# Patient Record
Sex: Female | Born: 1955 | Race: Black or African American | Hispanic: No | Smoking: Never smoker
Health system: Southern US, Community
[De-identification: ages and names within clinical notes are randomized; demographics above are authoritative.]

## PROBLEM LIST (undated history)

## (undated) DIAGNOSIS — I341 Nonrheumatic mitral (valve) prolapse: Secondary | ICD-10-CM

## (undated) DIAGNOSIS — I10 Essential (primary) hypertension: Secondary | ICD-10-CM

---

## 2005-01-12 ENCOUNTER — Encounter: Admission: RE | Admit: 2005-01-12 | Discharge: 2005-01-12 | Payer: Self-pay | Admitting: Internal Medicine

## 2005-01-25 ENCOUNTER — Other Ambulatory Visit: Admission: RE | Admit: 2005-01-25 | Discharge: 2005-01-25 | Payer: Self-pay | Admitting: Obstetrics and Gynecology

## 2006-04-28 ENCOUNTER — Ambulatory Visit: Payer: Self-pay | Admitting: Family Medicine

## 2006-04-28 LAB — CONVERTED CEMR LAB
ALT: 16 units/L (ref 0–40)
AST: 17 units/L (ref 0–37)
Albumin: 3.8 g/dL (ref 3.5–5.2)
Alkaline Phosphatase: 58 units/L (ref 39–117)
BUN: 7 mg/dL (ref 6–23)
CO2: 30 meq/L (ref 19–32)
Calcium: 9.3 mg/dL (ref 8.4–10.5)
Chloride: 105 meq/L (ref 96–112)
Cholesterol: 203 mg/dL (ref 0–200)
Creatinine, Ser: 0.8 mg/dL (ref 0.4–1.2)
Direct LDL: 157.3 mg/dL
GFR calc Af Amer: 98 mL/min
GFR calc non Af Amer: 81 mL/min
Glucose, Bld: 90 mg/dL (ref 70–99)
HDL: 41.2 mg/dL (ref >39.0)
Potassium: 3.7 meq/L (ref 3.5–5.1)
Sodium: 140 meq/L (ref 135–145)
Total Bilirubin: 0.8 mg/dL (ref 0.3–1.2)
Total CHOL/HDL Ratio: 4.9
Total Protein: 6.9 g/dL (ref 6.0–8.3)
Triglycerides: 58 mg/dL (ref 0–149)
VLDL: 12 mg/dL (ref 0–40)

## 2006-06-26 ENCOUNTER — Ambulatory Visit: Payer: Self-pay | Admitting: Family Medicine

## 2006-09-23 IMAGING — US US TRANSVAGINAL NON-OB
1 series · 14 of 25 positions shown · non-contrast
Comparison: none

CLINICAL DATA: Left lower quadrant pain.
TRANSABDOMINAL AND TRANSVAGINAL PELVIC ULTRASOUND:

[Series 1: unknown · 0.20mm/px · 14 of 71 slices shown]
[im 1/71]
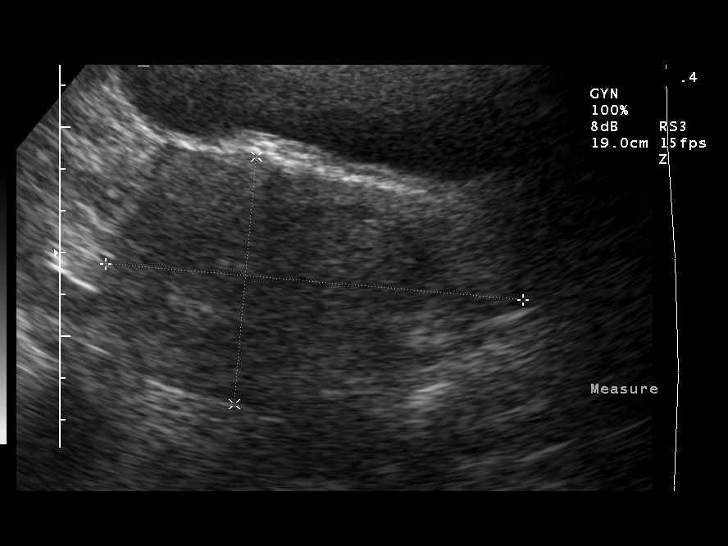
[im 6/71]
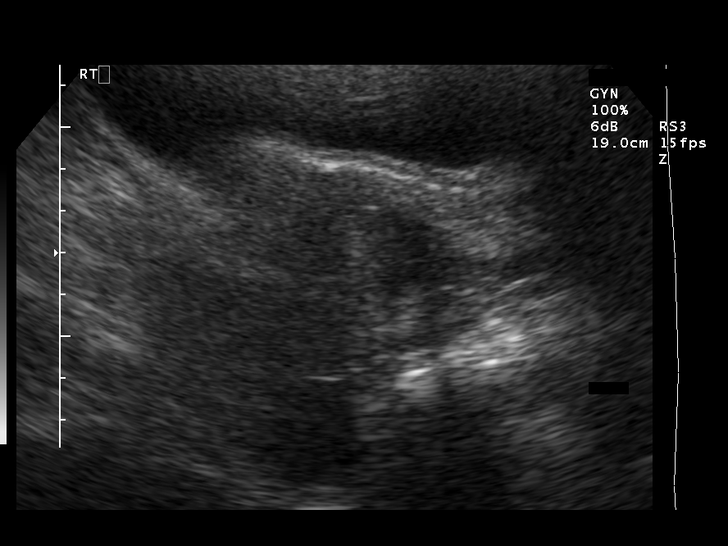
[im 12/71]
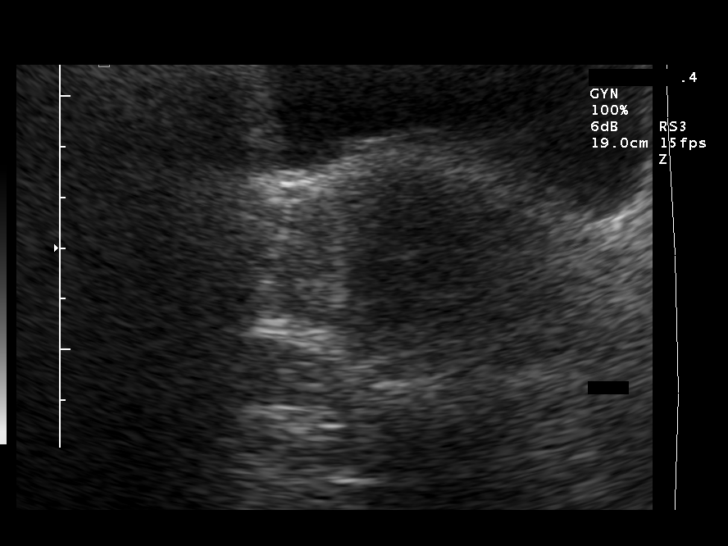
[im 18/71]
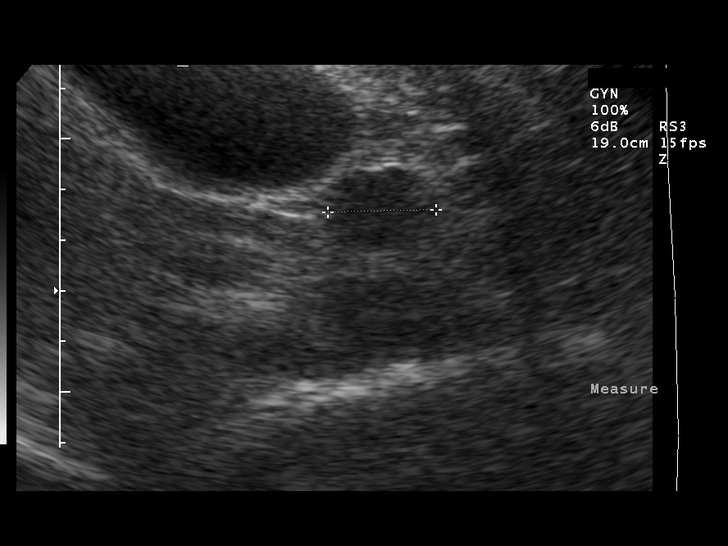
[im 24/71]
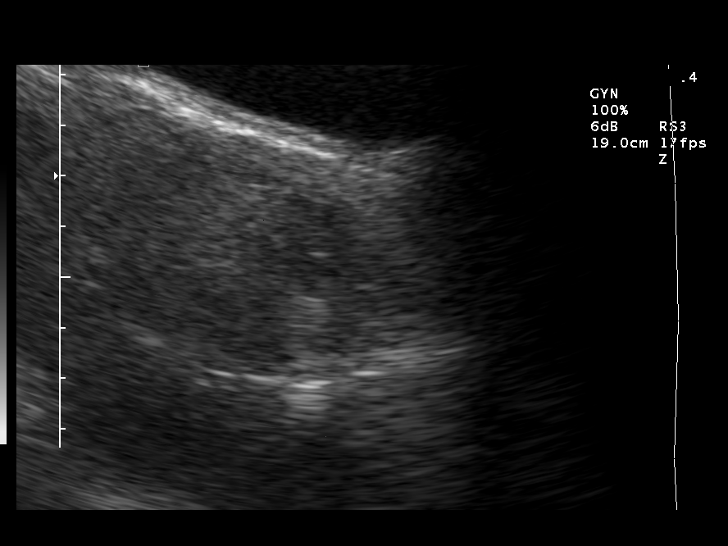
[im 27/71]
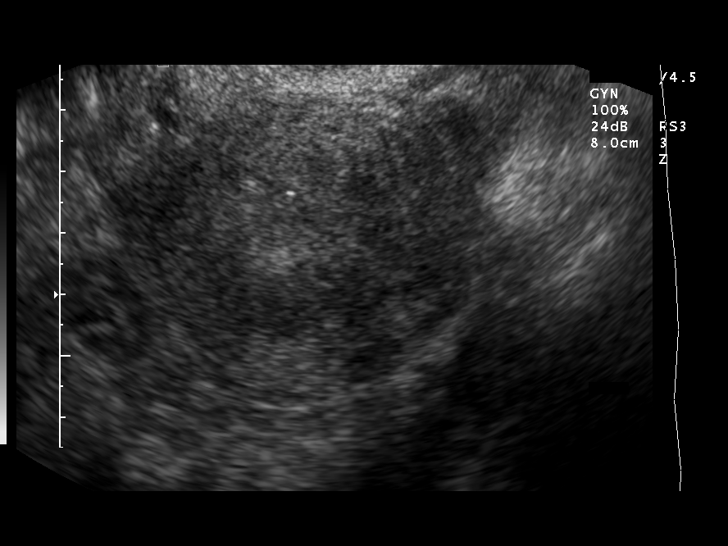
[im 33/71]
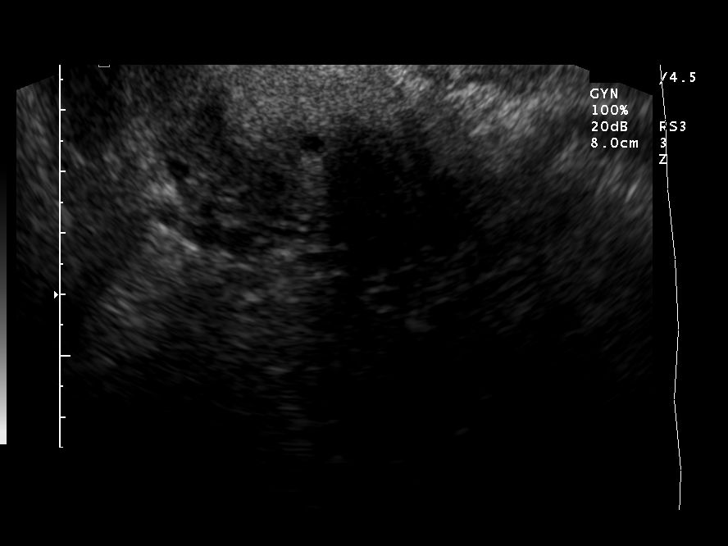
[im 38/71]
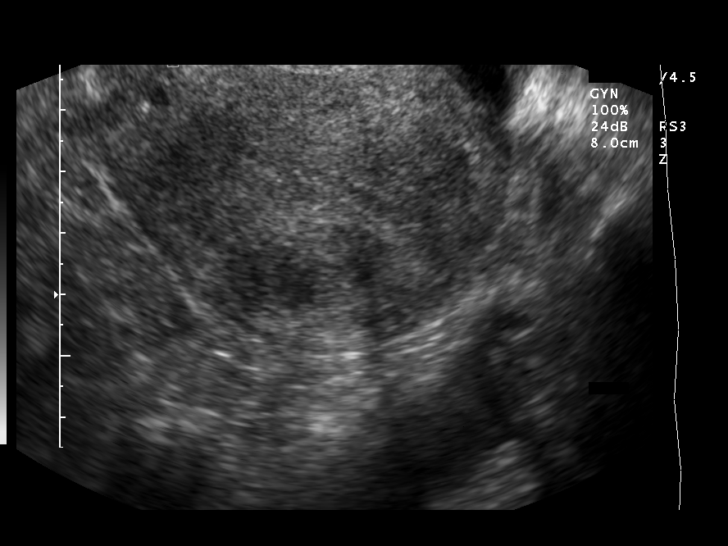
[im 44/71]
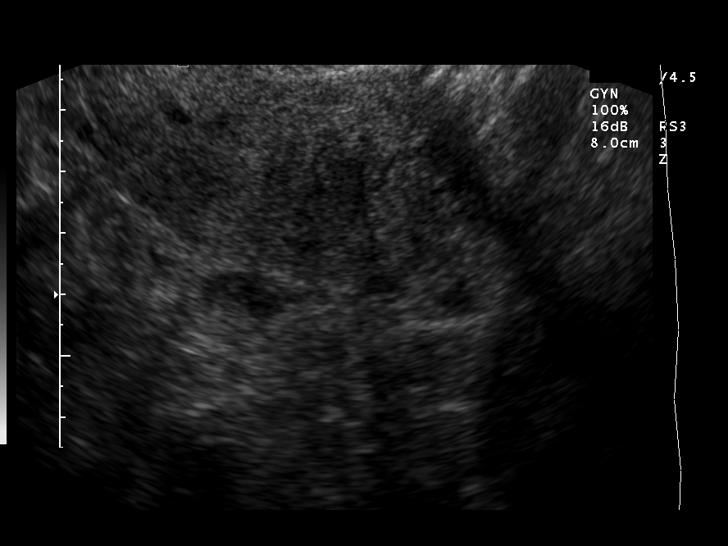
[im 47/71]
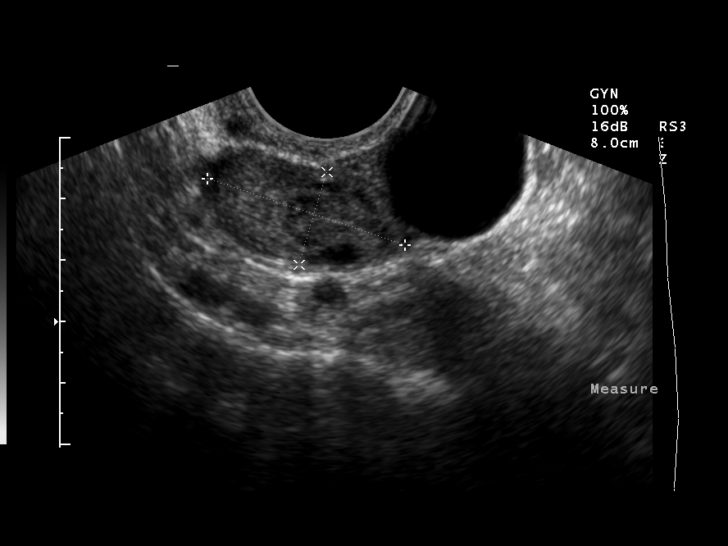
[im 53/71]
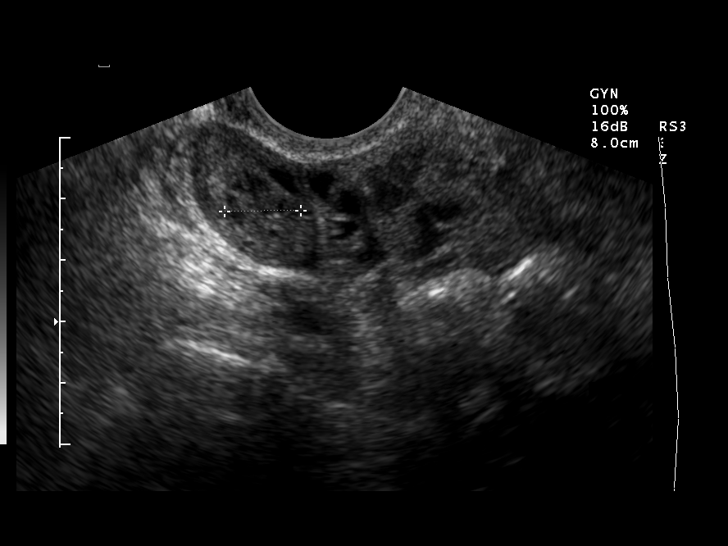
[im 59/71]
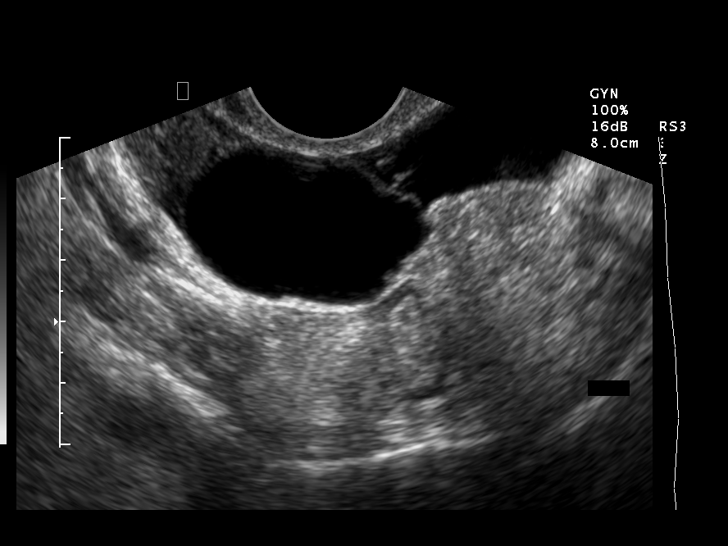
[im 65/71]
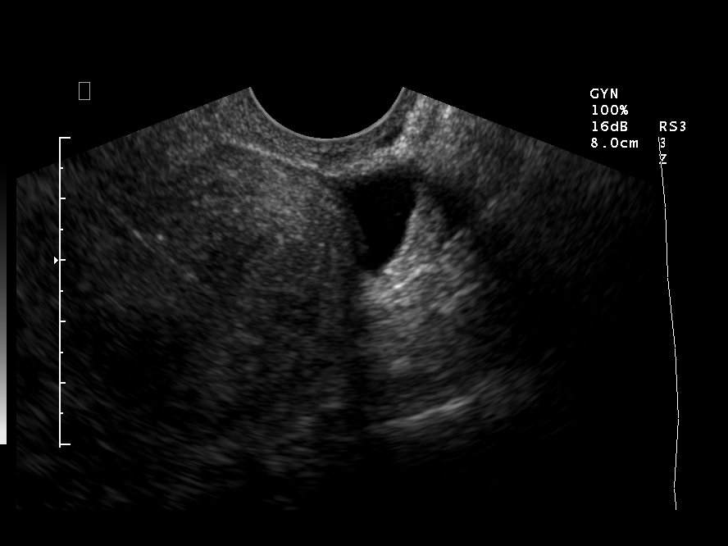
[im 71/71]
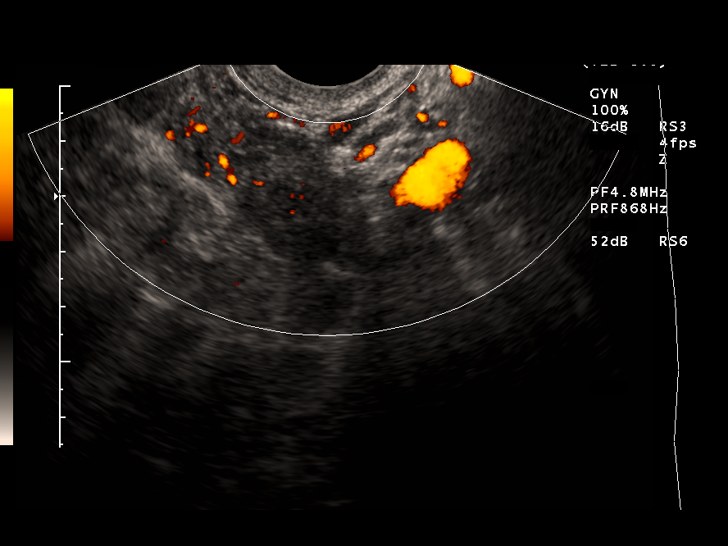

[14 of 25 positions shown; findings below may reference images not displayed]

FINDINGS: The scan demonstrates a right 4.4 x 3.4 x 3.3 cm paraovarian cyst with some minimal internal echoes.  There is also a 1.6 cm poorly defined small cyst in the right ovary which is most likely a collapsing cyst.  Overall size of the right ovary is 3.4 x 1.6 x 1.9 cm.  
The left ovary appears normal and measures 2.7 x 1.7 x 1.5 cm.  
There are two uterine fibroids. The largest measures 4.4 x 3.4 x 3.3 cm and is to the right side of the uterine fundus.  There is a 2.5 x 2.0 x 1.7 cm fibroid in the anterior aspect of the body of the uterus.  Overall size of the uterus is 10.0 x 5.9 x 7.2 cm.  
The endometrial thickness is normal at 8.1 mm, but is slightly heterogeneous.
There is a trace amount of free fluid in the pelvis.
IMPRESSION: 1.  No discrete abnormality in the left lower quadrant.  The patient does have a slightly complex right paraovarian cyst measuring 4.4 cm in maximum diameter.  I recommend follow-up of this lesion in two menstrual cycles. 
2.  Two uterine fibroids. 
3.  Otherwise normal exam.

## 2006-10-10 ENCOUNTER — Telehealth (INDEPENDENT_AMBULATORY_CARE_PROVIDER_SITE_OTHER): Payer: Self-pay | Admitting: *Deleted

## 2006-12-25 ENCOUNTER — Ambulatory Visit: Payer: Self-pay | Admitting: Family Medicine

## 2006-12-25 DIAGNOSIS — I10 Essential (primary) hypertension: Secondary | ICD-10-CM | POA: Insufficient documentation

## 2006-12-25 DIAGNOSIS — R229 Localized swelling, mass and lump, unspecified: Secondary | ICD-10-CM | POA: Insufficient documentation

## 2006-12-25 LAB — CONVERTED CEMR LAB
Bilirubin Urine: NEGATIVE
Blood in Urine, dipstick: NEGATIVE
Glucose, Urine, Semiquant: NEGATIVE
Ketones, urine, test strip: NEGATIVE
Nitrite: NEGATIVE
Protein, U semiquant: NEGATIVE
Specific Gravity, Urine: 1.02
Urobilinogen, UA: 0.2
WBC Urine, dipstick: NEGATIVE
pH: 6

## 2006-12-26 ENCOUNTER — Telehealth (INDEPENDENT_AMBULATORY_CARE_PROVIDER_SITE_OTHER): Payer: Self-pay | Admitting: *Deleted

## 2006-12-28 ENCOUNTER — Ambulatory Visit: Payer: Self-pay | Admitting: Family Medicine

## 2006-12-28 DIAGNOSIS — R209 Unspecified disturbances of skin sensation: Secondary | ICD-10-CM

## 2007-05-17 ENCOUNTER — Telehealth (INDEPENDENT_AMBULATORY_CARE_PROVIDER_SITE_OTHER): Payer: Self-pay | Admitting: *Deleted

## 2007-06-29 ENCOUNTER — Encounter: Payer: Self-pay | Admitting: Family Medicine

## 2007-07-15 ENCOUNTER — Telehealth (INDEPENDENT_AMBULATORY_CARE_PROVIDER_SITE_OTHER): Payer: Self-pay | Admitting: *Deleted

## 2008-02-20 ENCOUNTER — Encounter: Payer: Self-pay | Admitting: Internal Medicine

## 2009-04-30 ENCOUNTER — Encounter (INDEPENDENT_AMBULATORY_CARE_PROVIDER_SITE_OTHER): Payer: Self-pay

## 2009-05-01 ENCOUNTER — Ambulatory Visit: Payer: Self-pay | Admitting: Gastroenterology

## 2009-05-15 ENCOUNTER — Ambulatory Visit: Payer: Self-pay | Admitting: Gastroenterology

## 2010-05-04 NOTE — Miscellaneous (Signed)
Summary: Lec previsit  Clinical Lists Changes  Medications: Added new medication of MOVIPREP 100 GM  SOLR (PEG-KCL-NACL-NASULF-NA ASC-C) As per prep instructions. - Signed Rx of MOVIPREP 100 GM  SOLR (PEG-KCL-NACL-NASULF-NA ASC-C) As per prep instructions.;  #1 x 0;  Signed;  Entered by: Ulis Rias RN;  Authorized by: Louis Meckel MD;  Method used: Electronically to [id number] [place], [place], [place]  [address], Ph: [phone] or [phone], Fax: [phone] Observations: Added new observation of ALLERGY REV: Done (05/01/2009 10:44)    Prescriptions: MOVIPREP 100 GM  SOLR (PEG-KCL-NACL-NASULF-NA ASC-C) As per prep instructions.  #1 x 0   Entered by:   Ulis Rias RN   Authorized by:   Louis Meckel MD   Signed by:   Ulis Rias RN on 05/01/2009   Method used:   Electronically to        [facility] [address] (retail)       [address] [place], [place]  [address]       Ph: [phone] or [phone]       Fax: [phone]   RxID:   [id number]

## 2010-05-04 NOTE — Procedures (Signed)
Summary: Colonoscopy  Patient: Yesenia Sawyer Note: All result statuses are Final unless otherwise noted.  Tests: (1) Colonoscopy (COL)   COL Colonoscopy           DONE     [facility]     [address]     [place], [place]  [address]           COLONOSCOPY PROCEDURE REPORT           PATIENT:  Yesenia, Sawyer  MR#:  [id number]     BIRTHDATE:  1955-05-17, 53 yrs. old  GENDER:  female           ENDOSCOPIST:  Barbette Hair. Arlyce Dice, MD     Referred by:           PROCEDURE DATE:  05/15/2009     PROCEDURE:  Colonoscopy, Diagnostic     ASA CLASS:  Class II     INDICATIONS:  Routine Risk Screening           MEDICATIONS:   Fentanyl 100 mcg IV, Versed 9 mg IV           DESCRIPTION OF PROCEDURE:   After the risks benefits and     alternatives of the procedure were thoroughly explained, informed     consent was obtained.  Digital rectal exam was performed and     revealed no abnormalities.   The LB CF-[id number] endoscope     was introduced through the anus and advanced to the cecum, which     was identified by both the appendix and ileocecal valve, without     limitations.  The quality of the prep was excellent, using     MoviPrep.  The instrument was then slowly withdrawn as the colon     was fully examined.     <<PROCEDUREIMAGES>>           FINDINGS:  A normal appearing cecum, ileocecal valve, and     appendiceal orifice were identified. The ascending, hepatic     flexure, transverse, splenic flexure, descending, sigmoid colon,     and rectum appeared unremarkable (see image1, image3, image4,     image5, image7, image8, image9, image10, and image11).     Retroflexed views in the rectum revealed no abnormalities.    The     scope was then withdrawn from the patient and the procedure     completed.           COMPLICATIONS:  None           ENDOSCOPIC IMPRESSION:     1) Normal colon     RECOMMENDATIONS:     1) Continue current colorectal screening recommendations  for     "routine risk" patients with a repeat colonoscopy in 10 years.           REPEAT EXAM:  In 10 year(s) for Colonoscopy.           ______________________________     Barbette Hair. Arlyce Dice, MD           CC:  Lelon Perla, DO           n.     eSIGNED:   Barbette Hair. Zanetta Dehaan at 05/15/2009 10:54 AM           Welch-walker, Gaspar Cola, [id number]  Note: An exclamation mark (!) indicates a result that was not dispersed into the flowsheet. Document Creation Date: 05/15/2009 10:55 AM _______________________________________________________________________  (1) Order result status: Final Collection or  observation date-time: 05/15/2009 10:49 Requested date-time:  Receipt date-time:  Reported date-time:  Referring Physician:   Ordering Physician: Melvia Heaps [phone]) Specimen Source:  Source: Launa Grill Order Number: [id number] Lab site:   Appended Document: Colonoscopy    Clinical Lists Changes  Observations: Added new observation of COLONNXTDUE: 05/2019 (05/15/2009 11:05)

## 2010-05-04 NOTE — Letter (Signed)
Summary: Moviprep Instructions  [facility]  [address]   [place], [place] [address]   Phone: [phone]  Fax: [phone]       Yesenia Sawyer    27-Jul-1955    MRN: [id number]        Procedure Day /Date: Friday 05-15-09     Arrival Time: 9:00 a.m.     Procedure Time: 10:00 a.m.     Location of Procedure:                    _x _  [facility] (4th Floor)   PREPARATION FOR COLONOSCOPY WITH MOVIPREP   Starting 5 days prior to your procedure 05-10-09  do not eat nuts, seeds, popcorn, corn, beans, peas,  salads, or any raw vegetables.  Do not take any fiber supplements (e.g. Metamucil, Citrucel, and Benefiber).  THE DAY BEFORE YOUR PROCEDURE         DATE: 05-14-09    DAY: Thursday  1.  Drink clear liquids the entire day-NO SOLID FOOD  2.  Do not drink anything colored red or purple.  Avoid juices with pulp.  No orange juice.  3.  Drink at least 64 oz. (8 glasses) of fluid/clear liquids during the day to prevent dehydration and help the prep work efficiently.  CLEAR LIQUIDS INCLUDE: Water Jello Ice Popsicles Tea (sugar ok, no milk/cream) Powdered fruit flavored drinks Coffee (sugar ok, no milk/cream) Gatorade Juice: apple, white grape, white cranberry  Lemonade Clear bullion, consomm, broth Carbonated beverages (any kind) Strained chicken noodle soup Hard Candy                             4.  In the morning, mix first dose of MoviPrep solution:    Empty 1 Pouch A and 1 Pouch B into the disposable container    Add lukewarm drinking water to the top line of the container. Mix to dissolve    Refrigerate (mixed solution should be used within 24 hrs)  5.  Begin drinking the prep at 5:00 p.m. The MoviPrep container is divided by 4 marks.   Every 15 minutes drink the solution down to the next mark (approximately 8 oz) until the full liter is complete.   6.  Follow completed prep with 16 oz of clear liquid of your choice (Nothing red or  purple).  Continue to drink clear liquids until bedtime.  7.  Before going to bed, mix second dose of MoviPrep solution:    Empty 1 Pouch A and 1 Pouch B into the disposable container    Add lukewarm drinking water to the top line of the container. Mix to dissolve    Refrigerate  THE DAY OF YOUR PROCEDURE      DATE:  05-15-09  DAY: Friday  Beginning at 5:00 a.m. (5 hours before procedure):         1. Every 15 minutes, drink the solution down to the next mark (approx 8 oz) until the full liter is complete.  2. Follow completed prep with 16 oz. of clear liquid of your choice.    3. You may drink clear liquids until  8:00 a.m.  (2 HOURS BEFORE PROCEDURE).   MEDICATION INSTRUCTIONS  Unless otherwise instructed, you should take regular prescription medications with a small sip of water   as early as possible the morning of your procedure.          OTHER INSTRUCTIONS  You will need a responsible adult at least 53 years of age to accompany you and drive you home.   This person must remain in the waiting room during your procedure.  Wear loose fitting clothing that is easily removed.  Leave jewelry and other valuables at home.  However, you may wish to bring a book to read or  an iPod/MP3 player to listen to music as you wait for your procedure to start.  Remove all body piercing jewelry and leave at home.  Total time from sign-in until discharge is approximately 2-3 hours.  You should go home directly after your procedure and rest.  You can resume normal activities the  day after your procedure.  The day of your procedure you should not:   Drive   Make legal decisions   Operate machinery   Drink alcohol   Return to work  You will receive specific instructions about eating, activities and medications before you leave.    The above instructions have been reviewed and explained to me by   Ulis Rias RN  May 01, 2009 11:29 AM     I fully understand and  can verbalize these instructions _____________________________ Date _________

## 2012-01-22 ENCOUNTER — Encounter (HOSPITAL_BASED_OUTPATIENT_CLINIC_OR_DEPARTMENT_OTHER): Payer: Self-pay | Admitting: *Deleted

## 2012-01-22 ENCOUNTER — Emergency Department (HOSPITAL_BASED_OUTPATIENT_CLINIC_OR_DEPARTMENT_OTHER)
Admission: EM | Admit: 2012-01-22 | Discharge: 2012-01-22 | Disposition: A | Discharge status: Home / Self Care | Payer: Managed Care, Other (non HMO) | Attending: Emergency Medicine | Admitting: Emergency Medicine

## 2012-01-22 ENCOUNTER — Emergency Department (HOSPITAL_BASED_OUTPATIENT_CLINIC_OR_DEPARTMENT_OTHER): Payer: Managed Care, Other (non HMO)

## 2012-01-22 DIAGNOSIS — I10 Essential (primary) hypertension: Secondary | ICD-10-CM | POA: Insufficient documentation

## 2012-01-22 DIAGNOSIS — R51 Headache: Secondary | ICD-10-CM | POA: Insufficient documentation

## 2012-01-22 DIAGNOSIS — Z79899 Other long term (current) drug therapy: Secondary | ICD-10-CM | POA: Insufficient documentation

## 2012-01-22 HISTORY — DX: Essential (primary) hypertension: I10

## 2012-01-22 HISTORY — DX: Nonrheumatic mitral (valve) prolapse: I34.1

## 2012-01-22 LAB — COMPREHENSIVE METABOLIC PANEL
AST: 11 U/L (ref 0–37)
Albumin: 4.1 g/dL (ref 3.5–5.2)
Alkaline Phosphatase: 68 U/L (ref 39–117)
BUN: 9 mg/dL (ref 6–23)
CO2: 28 mEq/L (ref 19–32)
Calcium: 9.3 mg/dL (ref 8.4–10.5)
Chloride: 103 mEq/L (ref 96–112)
Creatinine, Ser: 0.8 mg/dL (ref 0.50–1.10)
GFR calc Af Amer: >90 mL/min (ref >90)
Glucose, Bld: 89 mg/dL (ref 70–99)
Potassium: 3.4 mEq/L — ABNORMAL LOW (ref 3.5–5.1)
Total Bilirubin: 0.6 mg/dL (ref 0.3–1.2)
Total Protein: 7.4 g/dL (ref 6.0–8.3)

## 2012-01-22 LAB — URINALYSIS, ROUTINE W REFLEX MICROSCOPIC
Glucose, UA: NEGATIVE mg/dL
Hgb urine dipstick: NEGATIVE
Ketones, ur: NEGATIVE mg/dL
Nitrite: NEGATIVE
Protein, ur: NEGATIVE mg/dL
Specific Gravity, Urine: 1.027 (ref 1.005–1.030)
Urobilinogen, UA: 1 mg/dL (ref 0.0–1.0)
pH: 6 (ref 5.0–8.0)

## 2012-01-22 LAB — CBC WITH DIFFERENTIAL/PLATELET
Basophils Absolute: 0 10*3/uL (ref 0.0–0.1)
Basophils Relative: 0 % (ref 0–1)
Eosinophils Absolute: 0.1 10*3/uL (ref 0.0–0.7)
Eosinophils Relative: 2 % (ref 0–5)
HCT: 38.3 % (ref 36.0–46.0)
Hemoglobin: 13.1 g/dL (ref 12.0–15.0)
MCH: 29.6 pg (ref 26.0–34.0)
MCHC: 34.2 g/dL (ref 30.0–36.0)
MCV: 86.5 fL (ref 78.0–100.0)
Monocytes Absolute: 0.5 10*3/uL (ref 0.1–1.0)
Monocytes Relative: 8 % (ref 3–12)
Neutro Abs: 3.6 10*3/uL (ref 1.7–7.7)
Neutrophils Relative %: 59 % (ref 43–77)
RDW: 13.2 % (ref 11.5–15.5)

## 2012-01-22 LAB — URINE MICROSCOPIC-ADD ON

## 2012-01-22 LAB — TROPONIN I: Troponin I: <0.3 ng/mL (ref <0.30)

## 2012-01-22 MED ORDER — METOPROLOL SUCCINATE ER 100 MG PO TB24
100.0000 mg | ORAL_TABLET | Freq: Two times a day (BID) | ORAL | Status: DC
  Filled 2012-01-22: qty 1

## 2012-01-22 MED ORDER — METRONIDAZOLE 500 MG PO TABS
2000.0000 mg | ORAL_TABLET | Freq: Once | ORAL | Status: AC
  Administered 2012-01-22: 2000 mg via ORAL
  Filled 2012-01-22: qty 4

## 2012-01-22 MED ORDER — METOPROLOL TARTRATE 50 MG PO TABS
100.0000 mg | ORAL_TABLET | Freq: Once | ORAL | Status: AC
  Administered 2012-01-22: 100 mg via ORAL

## 2012-01-22 MED ORDER — LISINOPRIL 10 MG PO TABS
20.0000 mg | ORAL_TABLET | Freq: Every day | ORAL | Status: DC
  Administered 2012-01-22: 20 mg via ORAL
  Filled 2012-01-22: qty 2

## 2012-01-22 MED ORDER — LISINOPRIL 20 MG PO TABS: 20.0000 mg | ORAL_TABLET | Freq: Every day | ORAL | Status: AC

## 2012-01-22 MED ORDER — METOPROLOL TARTRATE 100 MG PO TABS: 100.0000 mg | ORAL_TABLET | Freq: Two times a day (BID) | ORAL | Status: AC

## 2012-01-22 MED ORDER — METOPROLOL TARTRATE 50 MG PO TABS
ORAL_TABLET | ORAL | Status: AC
  Administered 2012-01-22: 100 mg via ORAL
  Filled 2012-01-22: qty 2

## 2012-01-22 NOTE — ED Notes (Signed)
Pt states she has been out of her medications for 2 weeks.  Now having some Headache and dizziness.  No chest pain or SOB.

## 2012-01-22 NOTE — ED Provider Notes (Signed)
History     CSN: [id number]  Arrival date & time 01/22/12  3086   First MD Initiated Contact with Patient 01/22/12 [phone]      Chief Complaint  Patient presents with  . Headache    (Consider location/radiation/quality/duration/timing/severity/associated sxs/prior treatment) HPI Comments: Patient complains of elevated blood pressure for the past 2 days. She states she knows it is elevated because she has pressure in her head and she is argumentative at home. She denies any chest pain, shortness of breath, nausea or vomiting. She denies any visual change. He describes gradual onset pressure in her head denies thunderclap onset. She's a history of hypertension has been out of her medications for the past week.  The history is provided by the patient.    Past Medical History  Diagnosis Date  . Hypertension   . Mitral prolapse     History reviewed. No pertinent past surgical history.  History reviewed. No pertinent family history.  History  Substance Use Topics  . Smoking status: Never Smoker   . Smokeless tobacco: Not on file  . Alcohol Use: No    OB History    Grav Para Term Preterm Abortions TAB SAB Ect Mult Living                  Review of Systems  Constitutional: Negative for fever, activity change and appetite change.  HENT: Negative for congestion, rhinorrhea and neck pain.   Eyes: Negative for photophobia, pain and visual disturbance.  Respiratory: Negative for cough, chest tightness and shortness of breath.   Cardiovascular: Negative for chest pain.  Gastrointestinal: Negative for nausea, vomiting and abdominal pain.  Genitourinary: Negative for vaginal bleeding and vaginal discharge.  Musculoskeletal: Negative for back pain.  Skin: Negative for rash.  Neurological: Positive for headaches. Negative for dizziness and weakness.    Allergies  Review of patient's allergies indicates no known allergies.  Home Medications   Current Outpatient Rx  Name Route  Sig Dispense Refill  . LISINOPRIL 20 MG PO TABS Oral Take 20 mg by mouth daily.    Marland Kitchen METOPROLOL SUCCINATE ER 100 MG PO TB24 Oral Take 100 mg by mouth 2 (two) times daily. Take with or immediately following a meal.      BP 175/86  Pulse 75  Temp 98.3 F (36.8 C)  Resp 20  Ht 5\' 3"  (1.6 m)  Wt 190 lb (86.183 kg)  BMI 33.66 kg/m2  SpO2 98%  Physical Exam  Constitutional: She is oriented to person, place, and time. She appears well-developed and well-nourished. No distress.  HENT:  Head: Normocephalic and atraumatic.  Mouth/Throat: Oropharynx is clear and moist. No oropharyngeal exudate.       No temporal artery tenderness  Eyes: Conjunctivae normal and EOM are normal. Pupils are equal, round, and reactive to light.  Neck: Normal range of motion. Neck supple.  Cardiovascular: Normal rate, regular rhythm and normal heart sounds.   No murmur heard. Pulmonary/Chest: Effort normal and breath sounds normal. No respiratory distress.  Abdominal: Soft. There is no tenderness. There is no rebound and no guarding.  Musculoskeletal: Normal range of motion. She exhibits no edema and no tenderness.  Neurological: She is alert and oriented to person, place, and time. No cranial nerve deficit.       CN 2-12 intact, 5/5 strength throughout, no ataxia on finger to nose.  Skin: Skin is warm.    ED Course  Procedures (including critical care time)  Labs Reviewed  COMPREHENSIVE METABOLIC  PANEL - Abnormal; Notable for the following:    Potassium 3.4 (*)     GFR calc non Af Amer 83 (*)     All other components within normal limits  URINALYSIS, ROUTINE W REFLEX MICROSCOPIC - Abnormal; Notable for the following:    APPearance CLOUDY (*)     Leukocytes, UA MODERATE (*)     All other components within normal limits  URINE MICROSCOPIC-ADD ON - Abnormal; Notable for the following:    Squamous Epithelial / LPF MANY (*)     Bacteria, UA MANY (*)     All other components within normal limits  CBC WITH  DIFFERENTIAL  TROPONIN I   Ct Head Wo Contrast  01/22/2012  *RADIOLOGY REPORT*  Clinical Data: Headache, hypertension  CT HEAD WITHOUT CONTRAST  Technique:  Contiguous axial images were obtained from the base of the skull through the vertex without contrast.  Comparison: None.  Findings: No skull fracture is noted.  Paranasal sinuses and mastoid air cells are unremarkable. There is no intracranial hemorrhage, mass effect or midline shift.  No acute infarction.  The gray and white matter differentiation is preserved.  No mass lesion is noted on this unenhanced scan.  IMPRESSION: No acute intracranial abnormality.   Original Report Authenticated By: Natasha Mead, M.D.      No diagnosis found.    MDM  Gradual onset headache with hypertension and noncompliance of medications. No chest pain or shortness of breath. Nonfocal neurological exam.  No evidence of endorgan damage. Creatinine normal, EKG normal, headache improved. CT negative. We'll restart home medications of lisinopril and Toprol (not Toprol XL per patient) and follow up with PCP.BP improved on recheck.  Patient informed of trichomonas in urine.  She states she is not sexually active.  UA appears contaminated.   Date: 01/22/2012  Rate: 59  Rhythm: normal sinus rhythm  QRS Axis: left  Intervals: normal  ST/T Wave abnormalities: normal  Conduction Disutrbances:none  Narrative Interpretation:   Old EKG Reviewed: none available       Glynn Octave, MD 01/22/12 1047

## 2012-01-22 NOTE — ED Notes (Signed)
Discharged with prescriptions.

## 2012-01-24 LAB — URINE CULTURE: Colony Count: 10000

## 2013-10-02 IMAGING — CT CT HEAD W/O CM
1 series · 16 of 30 positions shown, 20 images · non-contrast
Comparison: None.

CLINICAL DATA: Headache, hypertension

CT HEAD WITHOUT CONTRAST
TECHNIQUE: Contiguous axial images were obtained from the base of
the skull through the vertex without contrast.

[Series 2: head 4.8 h37s · axial · 0.46mm/px · z∈[-177,-44]mm · 16 of 32 slices shown, 20 images]
[im 2/32  brain]
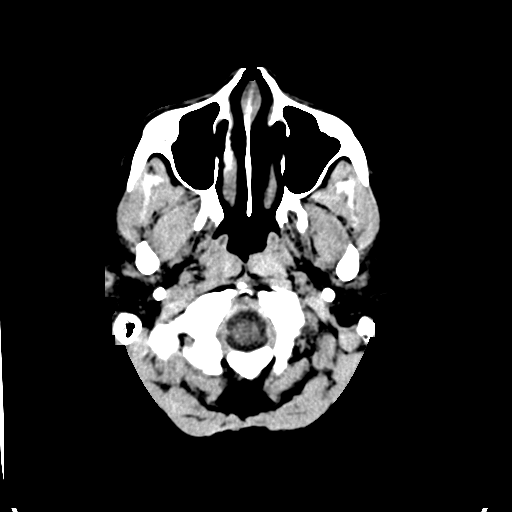
[im 2/32  bone]
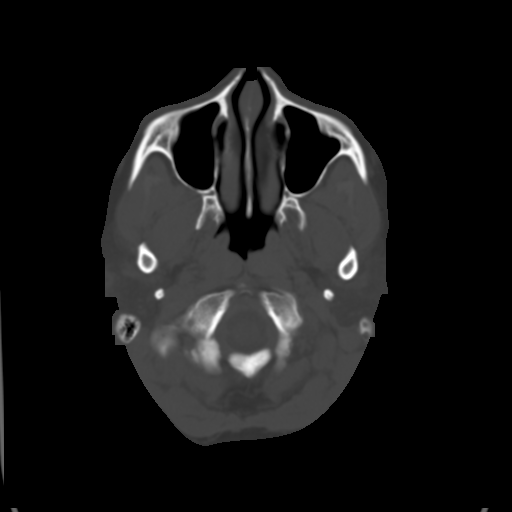
[im 4/32  brain]
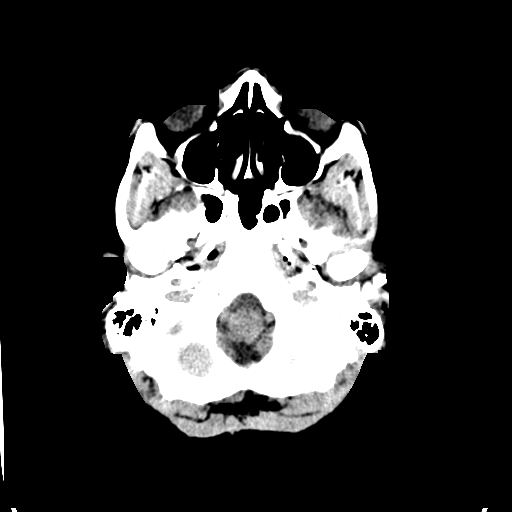
[im 6/32  brain]
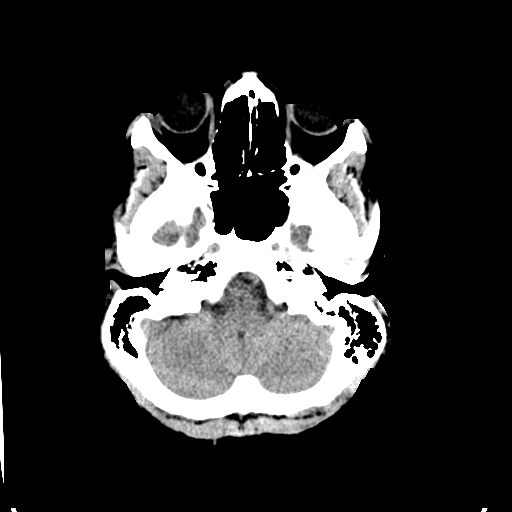
[im 8/32  brain]
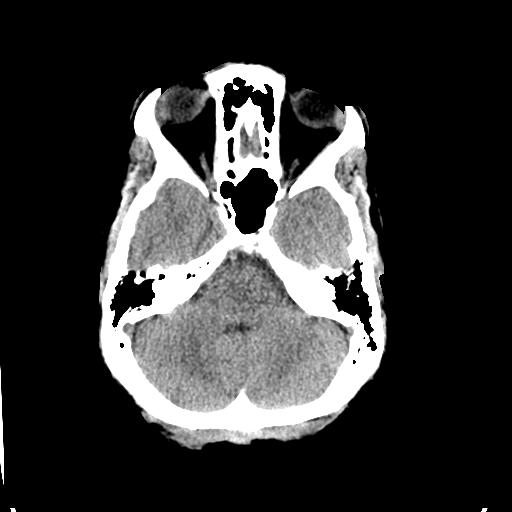
[im 9/32  brain]
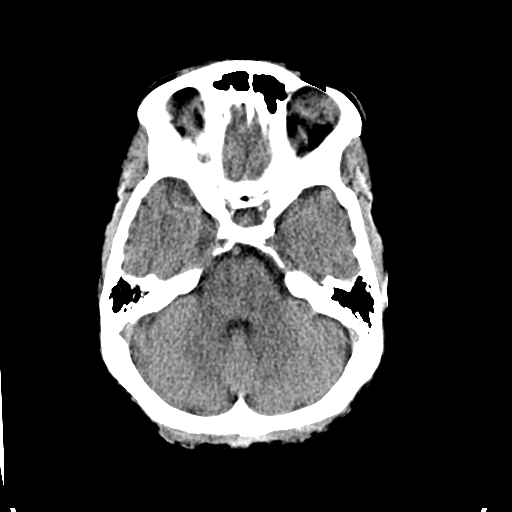
[im 9/32  bone]
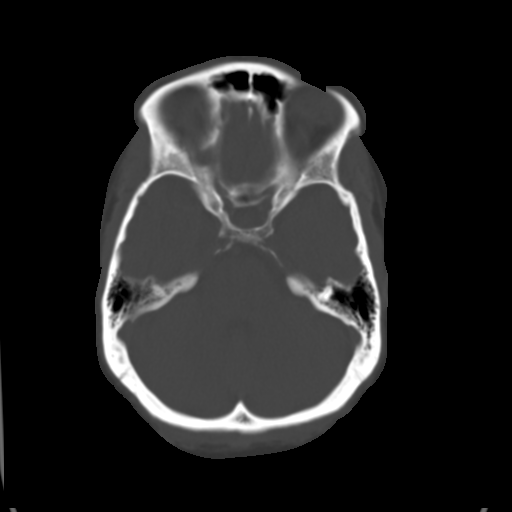
[im 11/32  brain]
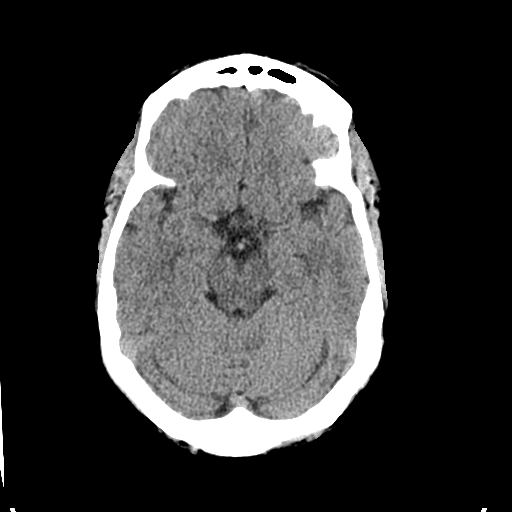
[im 13/32  brain]
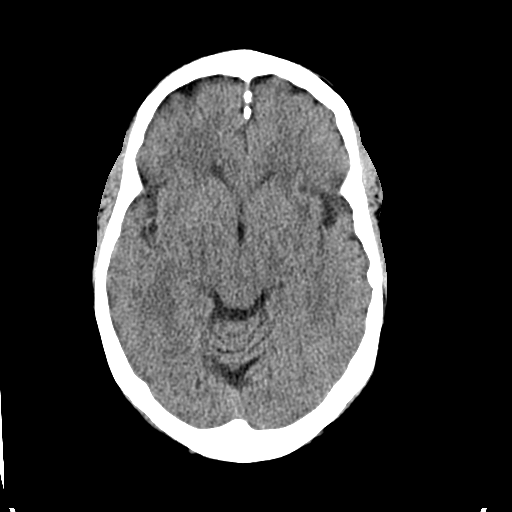
[im 15/32  brain]
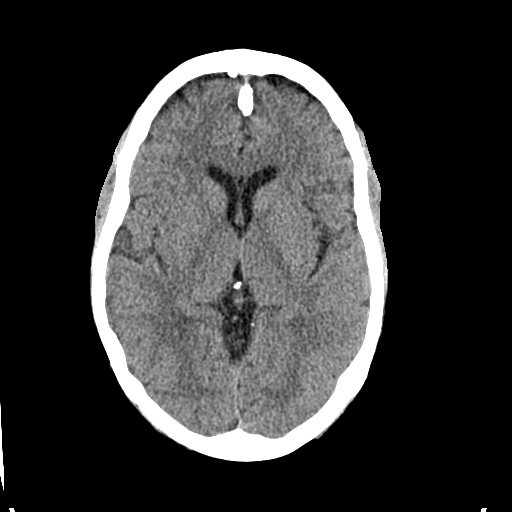
[im 17/32  brain]
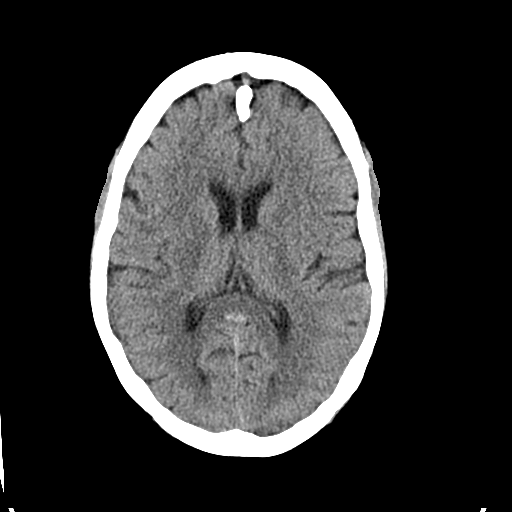
[im 17/32  bone]
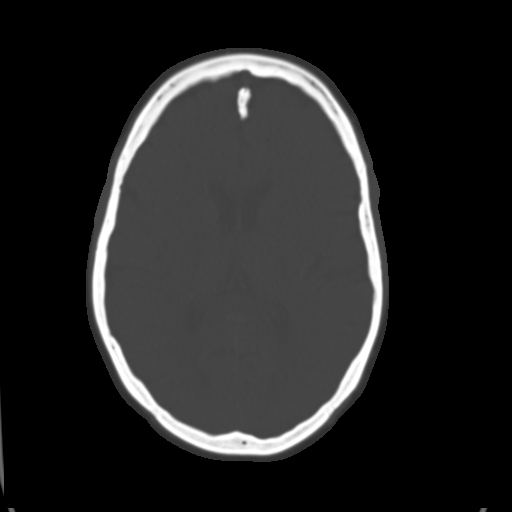
[im 19/32  brain]
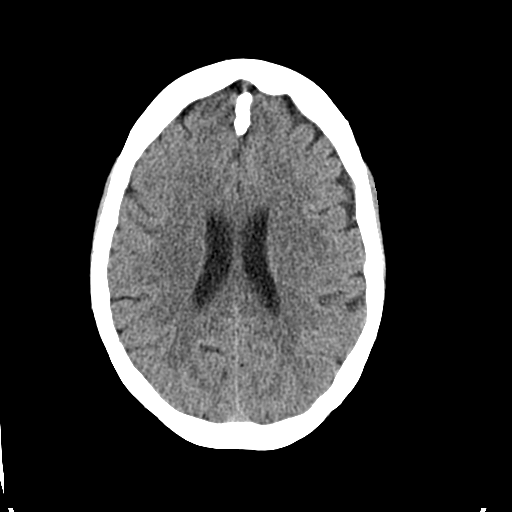
[im 21/32  brain]
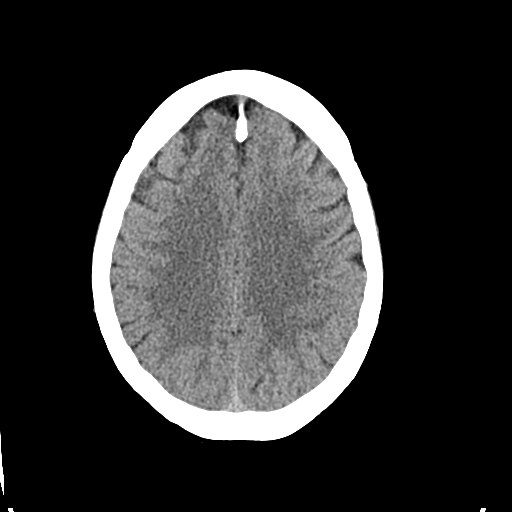
[im 23/32  brain]
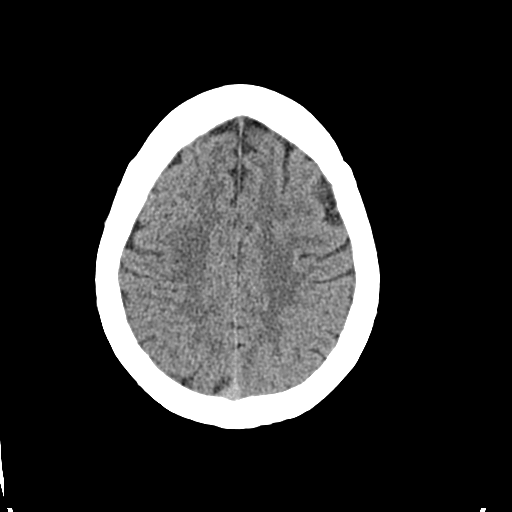
[im 24/32  brain]
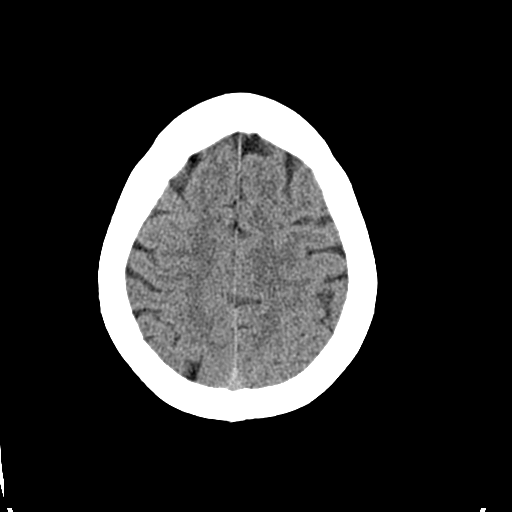
[im 24/32  bone]
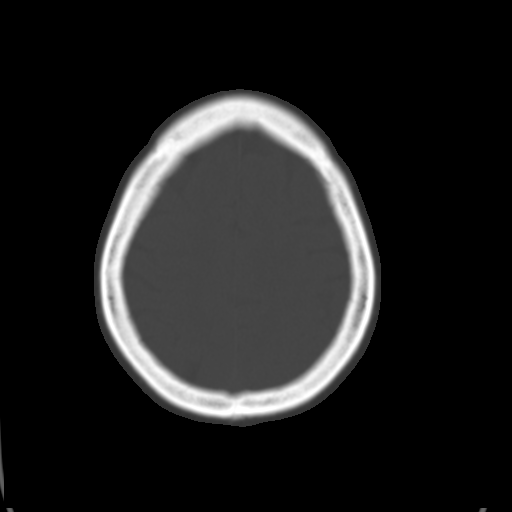
[im 26/32  brain]
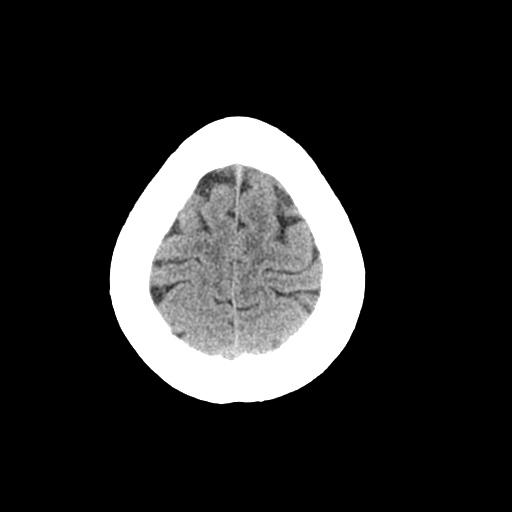
[im 28/32  brain]
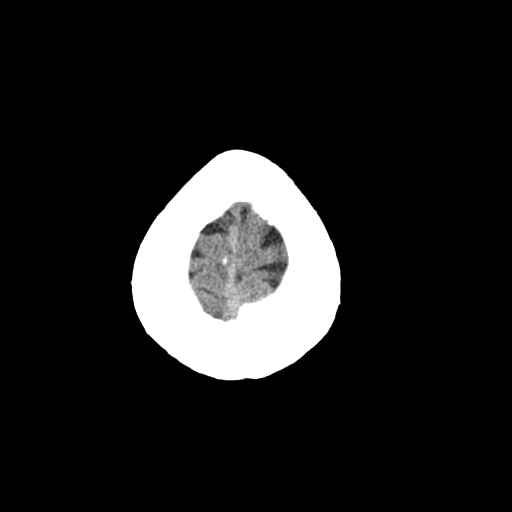
[im 30/32  brain]
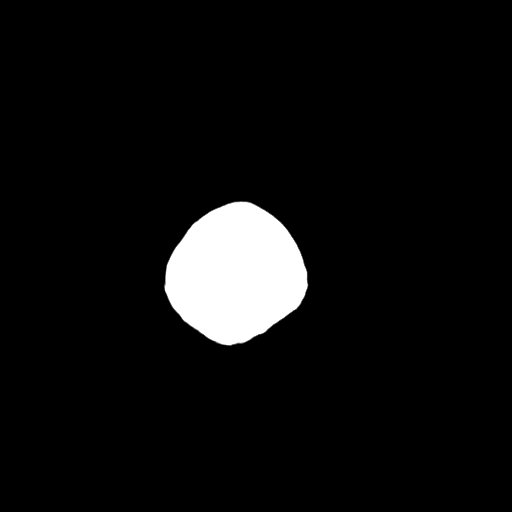

[16 of 30 positions shown; findings below may reference images not displayed]

FINDINGS: No skull fracture is noted.  Paranasal sinuses and
mastoid air cells are unremarkable. There is no intracranial
hemorrhage, mass effect or midline shift.

No acute infarction.  The gray and white matter differentiation is
preserved.  No mass lesion is noted on this unenhanced scan.
IMPRESSION: No acute intracranial abnormality.

## 2015-02-16 ENCOUNTER — Encounter: Payer: Self-pay | Admitting: Gastroenterology

## 2015-07-09 ENCOUNTER — Other Ambulatory Visit: Payer: Self-pay | Admitting: Family Medicine

## 2015-07-09 DIAGNOSIS — Z780 Asymptomatic menopausal state: Secondary | ICD-10-CM

## 2015-07-09 DIAGNOSIS — Z1231 Encounter for screening mammogram for malignant neoplasm of breast: Secondary | ICD-10-CM

## 2016-05-15 ENCOUNTER — Emergency Department (HOSPITAL_COMMUNITY): Payer: Managed Care, Other (non HMO)

## 2016-05-15 ENCOUNTER — Emergency Department (HOSPITAL_COMMUNITY)
Admission: EM | Admit: 2016-05-15 | Discharge: 2016-05-15 | Disposition: A | Discharge status: Home / Self Care | Payer: Managed Care, Other (non HMO) | Attending: Emergency Medicine | Admitting: Emergency Medicine

## 2016-05-15 ENCOUNTER — Encounter (HOSPITAL_COMMUNITY): Payer: Self-pay | Admitting: *Deleted

## 2016-05-15 DIAGNOSIS — R109 Unspecified abdominal pain: Secondary | ICD-10-CM

## 2016-05-15 DIAGNOSIS — J111 Influenza due to unidentified influenza virus with other respiratory manifestations: Secondary | ICD-10-CM | POA: Insufficient documentation

## 2016-05-15 DIAGNOSIS — R1010 Upper abdominal pain, unspecified: Secondary | ICD-10-CM | POA: Insufficient documentation

## 2016-05-15 DIAGNOSIS — Z79899 Other long term (current) drug therapy: Secondary | ICD-10-CM | POA: Insufficient documentation

## 2016-05-15 DIAGNOSIS — I10 Essential (primary) hypertension: Secondary | ICD-10-CM | POA: Insufficient documentation

## 2016-05-15 LAB — URINALYSIS, ROUTINE W REFLEX MICROSCOPIC
Bilirubin Urine: NEGATIVE
GLUCOSE, UA: NEGATIVE mg/dL
Hgb urine dipstick: NEGATIVE
KETONES UR: NEGATIVE mg/dL
NITRITE: NEGATIVE
PH: 6 (ref 5.0–8.0)
Protein, ur: NEGATIVE mg/dL
SPECIFIC GRAVITY, URINE: 1.019 (ref 1.005–1.030)

## 2016-05-15 LAB — COMPREHENSIVE METABOLIC PANEL
ALT: 18 U/L (ref 14–54)
AST: 24 U/L (ref 15–41)
Albumin: 4.4 g/dL (ref 3.5–5.0)
Alkaline Phosphatase: 66 U/L (ref 38–126)
Anion gap: 10 (ref 5–15)
BUN: 18 mg/dL (ref 6–20)
CHLORIDE: 95 mmol/L — AB (ref 101–111)
CO2: 29 mmol/L (ref 22–32)
Calcium: 9.3 mg/dL (ref 8.9–10.3)
Creatinine, Ser: 1.09 mg/dL — ABNORMAL HIGH (ref 0.44–1.00)
GFR calc Af Amer: >60 mL/min (ref >60)
GFR calc non Af Amer: 55 mL/min — ABNORMAL LOW (ref >60)
Glucose, Bld: 100 mg/dL — ABNORMAL HIGH (ref 65–99)
POTASSIUM: 4.1 mmol/L (ref 3.5–5.1)
Sodium: 134 mmol/L — ABNORMAL LOW (ref 135–145)
Total Bilirubin: 0.4 mg/dL (ref 0.3–1.2)
Total Protein: 8.1 g/dL (ref 6.5–8.1)

## 2016-05-15 LAB — CBC
HEMATOCRIT: 41.3 % (ref 36.0–46.0)
Hemoglobin: 14.2 g/dL (ref 12.0–15.0)
MCH: 30.1 pg (ref 26.0–34.0)
MCHC: 34.4 g/dL (ref 30.0–36.0)
MCV: 87.5 fL (ref 78.0–100.0)
Platelets: 256 10*3/uL (ref 150–400)
RBC: 4.72 MIL/uL (ref 3.87–5.11)
RDW: 13.1 % (ref 11.5–15.5)
WBC: 5.2 10*3/uL (ref 4.0–10.5)

## 2016-05-15 LAB — LIPASE, BLOOD: LIPASE: 21 U/L (ref 11–51)

## 2016-05-15 MED ORDER — SODIUM CHLORIDE 0.9 % IV BOLUS (SEPSIS)
1000.0000 mL | Freq: Once | INTRAVENOUS | Status: AC
  Administered 2016-05-15: 1000 mL via INTRAVENOUS

## 2016-05-15 NOTE — Discharge Instructions (Signed)
Continue to stay well hydrated.  Follow up with your primary care provider if symptoms have not improved by Monday.   Please seek immediate care if you develop any of the following symptoms: The pain does not go away.  Uncontrollable fever You throw up (vomit) You pass bloody or black tarry stools.  There is bright red blood in the stool.  There is rectal pain.  You do not seem to be getting better.  You have any questions or concerns.

## 2016-05-15 NOTE — ED Triage Notes (Signed)
Pt reports pain in her abdomen for about 2 days, describes as a "gripping pain". Pt has not had BM since Monday, took colace and was able to have a BM today. Pt has also had nausea. No urinary symptoms.  Pt was also dx with the flu yesterday.

## 2016-05-15 NOTE — ED Provider Notes (Signed)
[facility]-EMERGENCY DEPT Provider Note   CSN: [id number] Arrival date & time: 05/15/16  0141     History   Chief Complaint Chief Complaint  Patient presents with  . Abdominal Pain    HPI Yesenia Sawyer is a 61 y.o. female.  The history is provided by the patient and medical records. No language interpreter was used.  Abdominal Pain   Associated symptoms include nausea and myalgias. Pertinent negatives include fever (Earlier this week, but now resolved), diarrhea, vomiting, constipation, dysuria, frequency and headaches.   Yesenia Sawyer is a 61 y.o. female  with a PMH of HTN who presents to the Emergency Department complaining of intermittent "tightening, squeezing" pain off and on for the last 4-5 days. Associated with nausea, no vomiting or diarrhea. No urinary symptoms. She has taken tums with little relief. Initially thought she may be constipated so she took colace yesterday am. She had a soft, nonbloody bowel movement around 8 pm. She was seen by her primary care provider on 2/09 where she tested positive for the flu. PCP thought abdominal pain was likely 2/2 viral illness. Patient states that she googled all of her symptoms to be sure and is now worried that she has something wrong with her gallbladder.   Past Medical History:  Diagnosis Date  . Hypertension   . Mitral prolapse     Patient Active Problem List   Diagnosis Date Noted  . PARESTHESIA 12/28/2006  . HYPERTENSION 12/25/2006  . SYMP SWELL/MASS/LUMP, LOCALIZED SUPERFICIAL 12/25/2006    History reviewed. No pertinent surgical history.  OB History    No data available       Home Medications    Prior to Admission medications   Medication Sig Start Date End Date Taking? Authorizing Provider  lisinopril (PRINIVIL,ZESTRIL) 20 MG tablet Take 20 mg by mouth daily.    Historical Provider, MD  lisinopril (PRINIVIL,ZESTRIL) 20 MG tablet Take 1 tablet (20 mg total) by mouth daily. 01/22/12   Glynn Octave, MD   metoprolol (LOPRESSOR) 100 MG tablet Take 1 tablet (100 mg total) by mouth 2 (two) times daily. 01/22/12   Glynn Octave, MD  metoprolol succinate (TOPROL-XL) 100 MG 24 hr tablet Take 100 mg by mouth 2 (two) times daily. Take with or immediately following a meal.    Historical Provider, MD    Family History No family history on file.  Social History Social History  Substance Use Topics  . Smoking status: Never Smoker  . Smokeless tobacco: Never Used  . Alcohol use No     Allergies   Patient has no known allergies.   Review of Systems Review of Systems  Constitutional: Positive for chills. Negative for fever (Earlier this week, but now resolved).  HENT: Negative for congestion.   Eyes: Negative for visual disturbance.  Respiratory: Negative for cough and shortness of breath.   Cardiovascular: Negative.   Gastrointestinal: Positive for abdominal pain and nausea. Negative for blood in stool, constipation, diarrhea and vomiting.  Genitourinary: Negative for dysuria, frequency and urgency.  Musculoskeletal: Positive for myalgias. Negative for back pain.  Skin: Negative for rash.  Neurological: Negative for headaches.     Physical Exam Updated Vital Signs BP 135/74   Pulse 60   Temp 98.5 F (36.9 C) (Oral)   Resp 18   SpO2 98%   Physical Exam  Constitutional: She is oriented to person, place, and time. She appears well-developed and well-nourished. No distress.  HENT:  Head: Normocephalic and atraumatic.  Cardiovascular: Normal rate, regular  rhythm and normal heart sounds.   No murmur heard. Pulmonary/Chest: Effort normal and breath sounds normal. No respiratory distress. She has no wheezes. She has no rales.  Abdominal: Soft. Bowel sounds are normal. She exhibits no distension. There is no rebound and no guarding.  Tenderness to palpation along upper abdomen with no rebound or guarding. Negative Murphy's.   Musculoskeletal: She exhibits no edema.  Neurological:  She is alert and oriented to person, place, and time.  Skin: Skin is warm and dry.  Nursing note and vitals reviewed.    ED Treatments / Results  Labs (all labs ordered are listed, but only abnormal results are displayed) Labs Reviewed  COMPREHENSIVE METABOLIC PANEL - Abnormal; Notable for the following:       Result Value   Sodium 134 (*)    Chloride 95 (*)    Glucose, Bld 100 (*)    Creatinine, Ser 1.09 (*)    GFR calc non Af Amer 55 (*)    All other components within normal limits  URINALYSIS, ROUTINE W REFLEX MICROSCOPIC - Abnormal; Notable for the following:    APPearance HAZY (*)    Leukocytes, UA TRACE (*)    Bacteria, UA RARE (*)    Squamous Epithelial / LPF 6-30 (*)    All other components within normal limits  LIPASE, BLOOD  CBC    EKG  EKG Interpretation None       Radiology Kore[place] Abdomen Limited Ruq  Result Date: 05/15/2016 CLINICAL DATA:  Abdominal pain for 2 days. Nausea and vomiting. History of hypertension. EXAM: US ABDOMEN LIMITED - RIGHT UPPER QUADRANT COMPARISON:  None. FINDINGS: Gallbladder: No gallstones or wall thickening visualized. No sonographic Murphy sign noted by sonographer. Common bile duct: Diameter: 2.2 mm, normal Liver: No focal lesion identified. Within normal limits in parenchymal echogenicity. IMPRESSION: Normal examination. Electronically Signed   By: Burman NievesWilliam  Stevens M.D.   On: 05/15/2016 06:49    Procedures Procedures (including critical care time)  Medications Ordered in ED Medications  sodium chloride 0.9 % bolus 1,000 mL (0 mLs Intravenous Stopped 05/15/16 0622)     Initial Impression / Assessment and Plan / ED Course  I have reviewed the triage vital signs and the nursing notes.  Pertinent labs & imaging results that were available during my care of the patient were reviewed by me and considered in my medical decision making (see chart for details).    Yesenia Sawyer is a 61 y.o. female who presents to ED for upper  abdominal pain and nausea for 4-5 days now. Seen by PCP 2 days ago and tested positive for flu. Patient is afebrile today, stating fevers have now resolved. Vitals reassuring. Non-surgical abdomen with upper abdominal tenderness. Patient googled her symptoms and states she is concerned sxs are 2/2 her gallbladder. RUQ ultrasound negative. Labs with Cr of 1.09 - 1L IV fluids given. UA with no signs of infection. CBC with normal white count. Lipase wdl. Patient tolerating PO without difficulty. Evaluation does not show pathology that would require ongoing emergent intervention or inpatient treatment. PCP follow up if symptoms not resolved on Monday. Return precautions discussed and all questions answered.    Final Clinical Impressions(s) / ED Diagnoses   Final diagnoses:  Abdominal pain  Influenza    New Prescriptions Discharge Medication List as of 05/15/2016  6:55 AM       Chase PicketJaime Pilcher Davyd Podgorski, PA-C 05/15/16 [id number]    Cy BlamerApril Palumbo, MD 05/15/16 0740

## 2017-04-03 ENCOUNTER — Other Ambulatory Visit: Payer: Self-pay | Admitting: Family Medicine

## 2017-04-03 DIAGNOSIS — Z139 Encounter for screening, unspecified: Secondary | ICD-10-CM

## 2017-04-07 ENCOUNTER — Ambulatory Visit: Payer: Managed Care, Other (non HMO)

## 2017-09-06 ENCOUNTER — Ambulatory Visit: Payer: Managed Care, Other (non HMO)

## 2018-08-22 ENCOUNTER — Other Ambulatory Visit: Payer: Self-pay | Admitting: Family Medicine

## 2018-08-22 DIAGNOSIS — Z1231 Encounter for screening mammogram for malignant neoplasm of breast: Secondary | ICD-10-CM

## 2018-12-24 IMAGING — US US ABDOMEN LIMITED
1 series · 14 of 25 positions shown · non-contrast
Comparison: None.

CLINICAL DATA: Abdominal pain for 2 days. Nausea and vomiting.
History of hypertension.

EXAM:
US ABDOMEN LIMITED - RIGHT UPPER QUADRANT

[Series 1: us abdomen limited · 0.22mm/px · 14 of 37 slices shown]
[im 1/37]
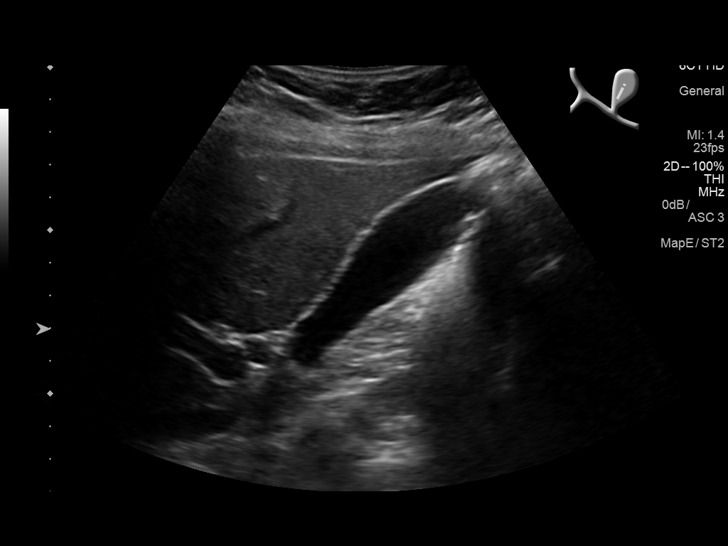
[im 4/37]
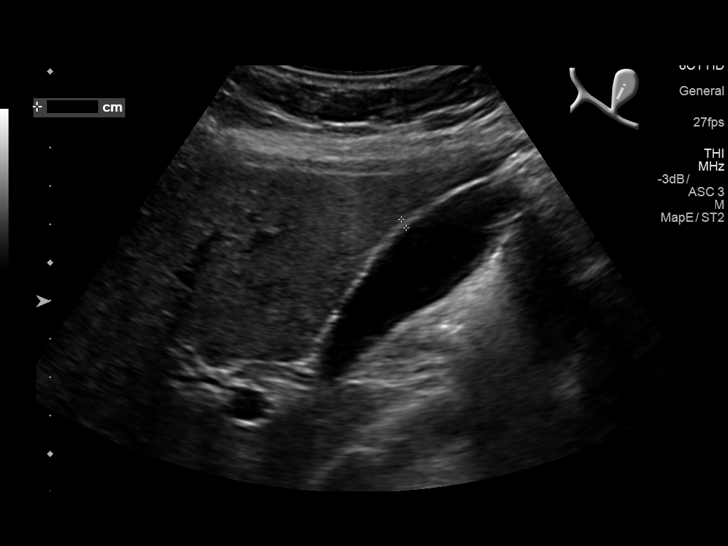
[im 7/37]
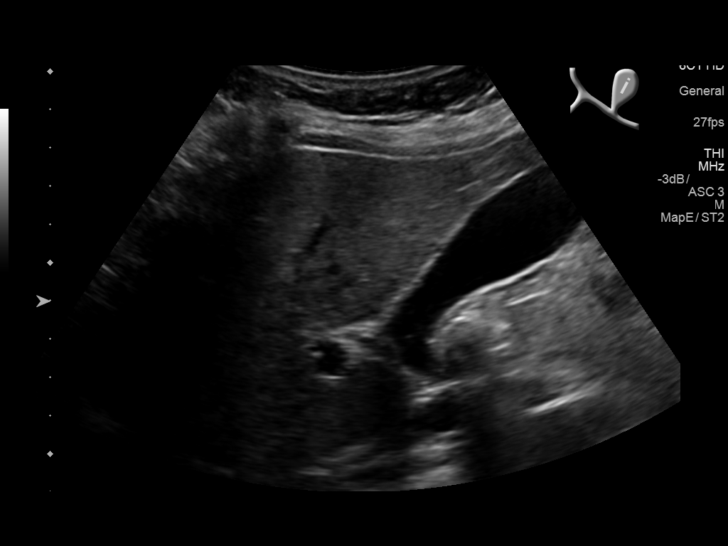
[im 10/37]
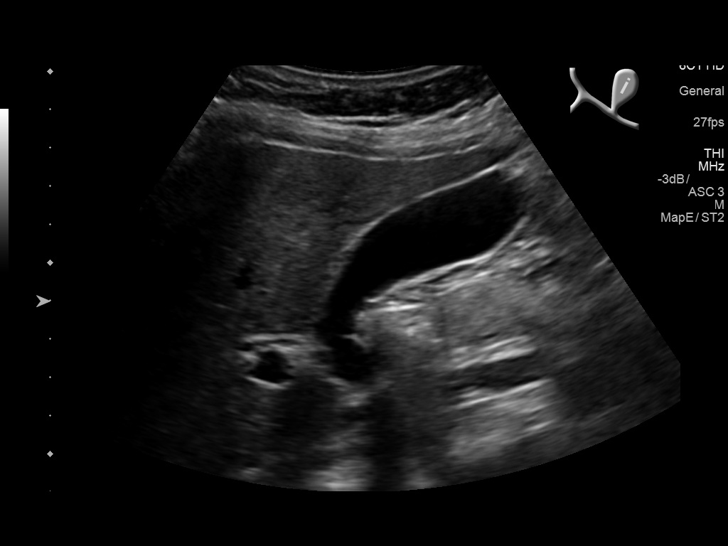
[im 13/37]
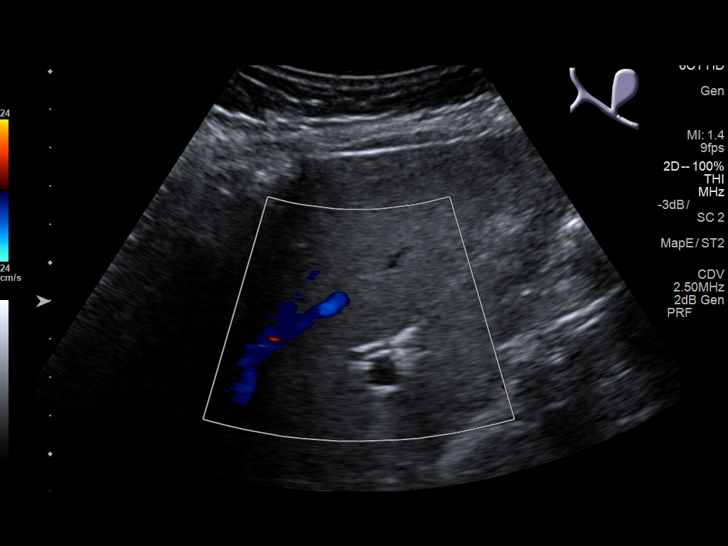
[im 14/37]
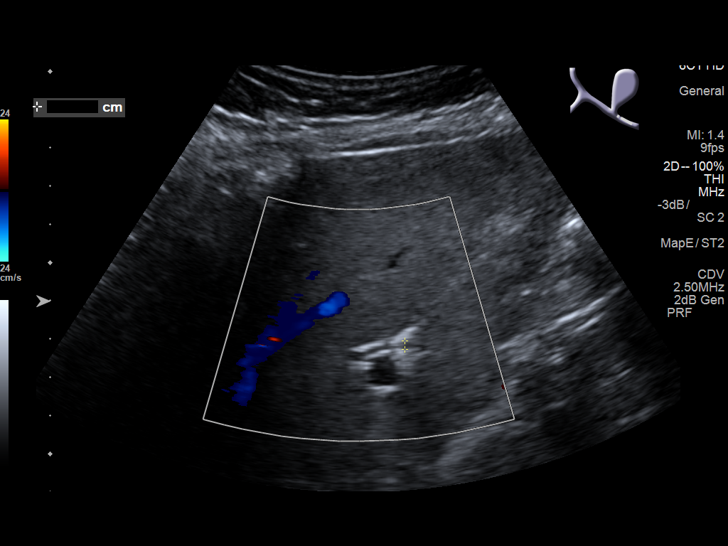
[im 17/37]
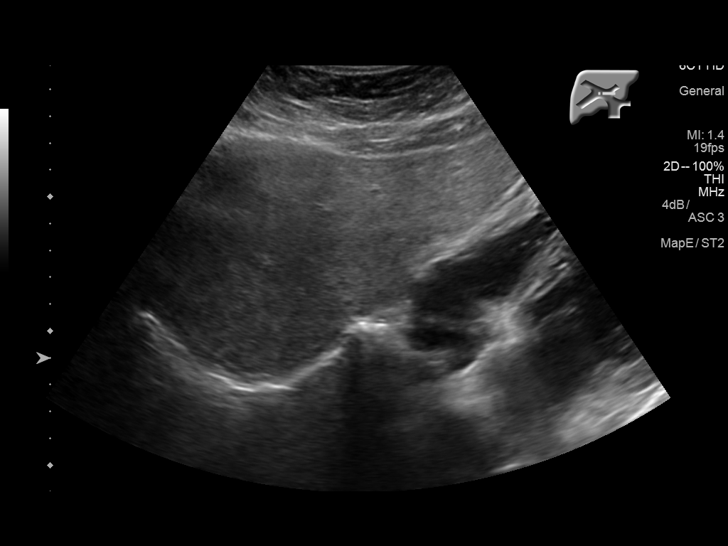
[im 20/37]
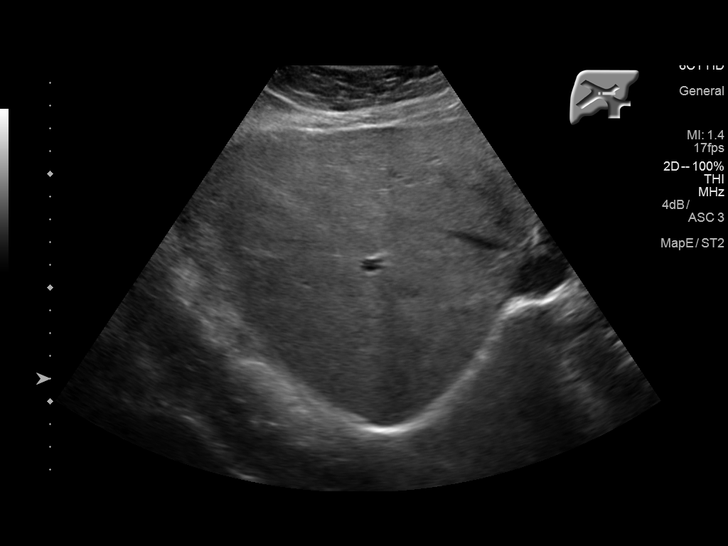
[im 23/37]
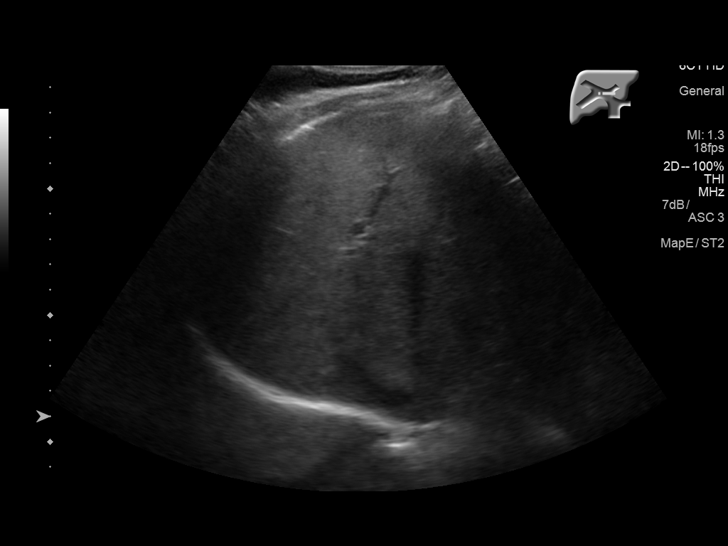
[im 25/37]
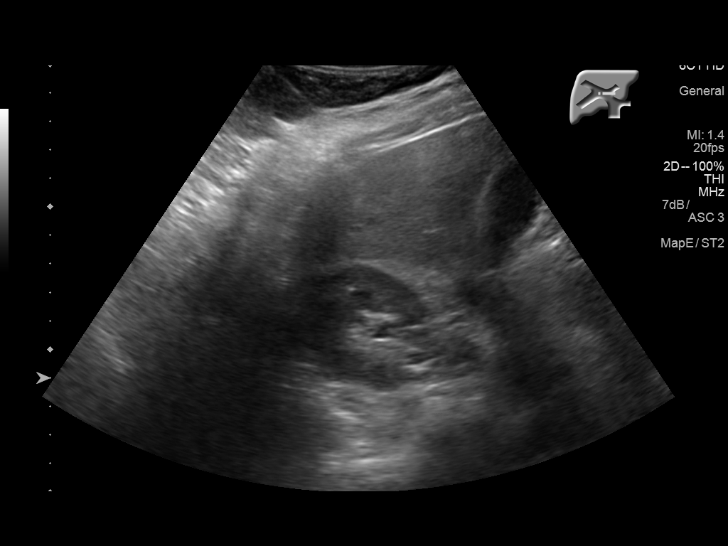
[im 28/37]
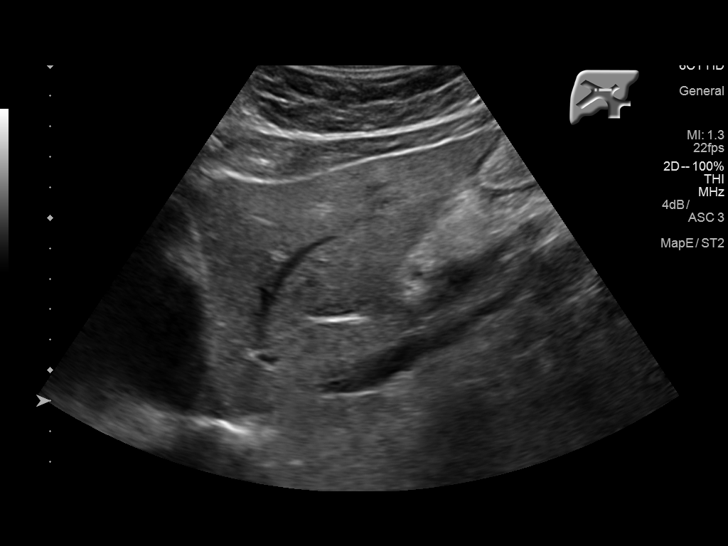
[im 31/37]
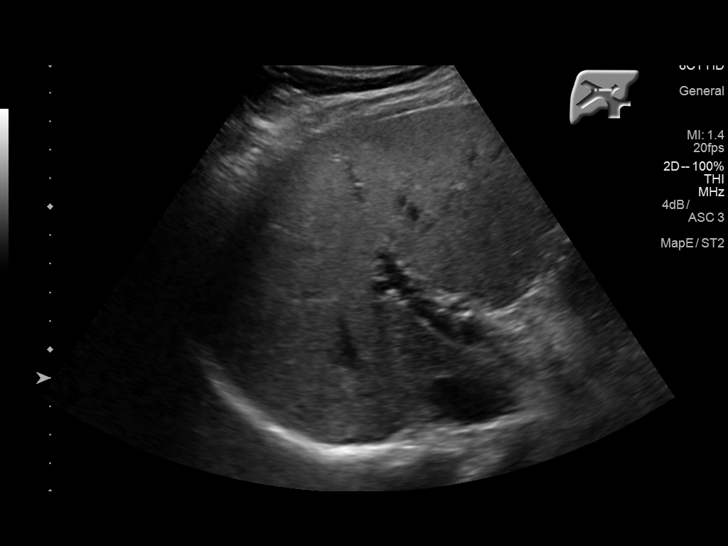
[im 34/37]
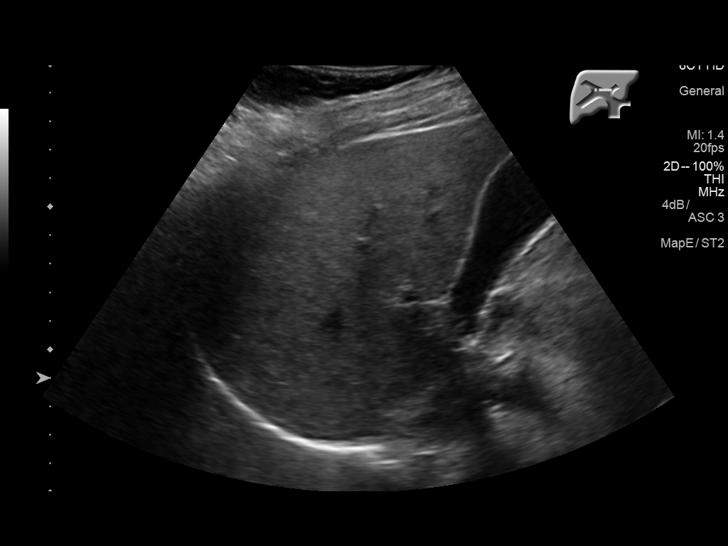
[im 37/37]
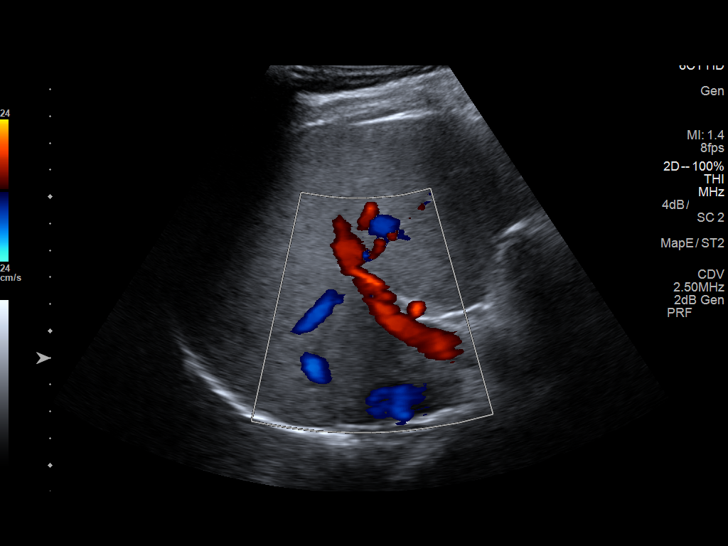

[14 of 25 positions shown; findings below may reference images not displayed]

FINDINGS: Gallbladder:

No gallstones or wall thickening visualized. No sonographic Murphy
sign noted by sonographer.

Common bile duct:

Diameter: 2.2 mm, normal

Liver:

No focal lesion identified. Within normal limits in parenchymal
echogenicity.
IMPRESSION: Normal examination.
